# Patient Record
Sex: Female | Born: 1960 | Race: White | Hispanic: No | Marital: Married
Health system: Southern US, Community
[De-identification: ages and names within clinical notes are randomized; demographics above are authoritative.]

---

## 1998-08-12 ENCOUNTER — Other Ambulatory Visit: Admission: RE | Admit: 1998-08-12 | Discharge: 1998-08-12 | Payer: Self-pay | Admitting: Obstetrics and Gynecology

## 2003-08-20 ENCOUNTER — Other Ambulatory Visit: Admission: RE | Admit: 2003-08-20 | Discharge: 2003-08-20 | Payer: Self-pay | Admitting: Obstetrics and Gynecology

## 2003-08-29 ENCOUNTER — Emergency Department (HOSPITAL_COMMUNITY): Admission: EM | Admit: 2003-08-29 | Discharge: 2003-08-29 | Payer: Self-pay | Admitting: Emergency Medicine

## 2004-04-16 ENCOUNTER — Other Ambulatory Visit: Admission: RE | Admit: 2004-04-16 | Discharge: 2004-04-16 | Payer: Self-pay | Admitting: Diagnostic Radiology

## 2008-11-05 ENCOUNTER — Encounter: Admission: RE | Admit: 2008-11-05 | Discharge: 2008-11-05 | Payer: Self-pay | Admitting: Endocrinology

## 2010-05-13 ENCOUNTER — Encounter: Admission: RE | Admit: 2010-05-13 | Discharge: 2010-05-13 | Payer: Self-pay | Admitting: Endocrinology

## 2010-07-27 ENCOUNTER — Encounter: Payer: Self-pay | Admitting: Endocrinology

## 2011-04-28 ENCOUNTER — Other Ambulatory Visit: Payer: Self-pay | Admitting: Endocrinology

## 2011-04-28 DIAGNOSIS — E049 Nontoxic goiter, unspecified: Secondary | ICD-10-CM

## 2011-05-04 ENCOUNTER — Ambulatory Visit
Admission: RE | Admit: 2011-05-04 | Discharge: 2011-05-04 | Disposition: A | Discharge status: Home / Self Care | Payer: BC Managed Care – PPO | Source: Ambulatory Visit | Attending: Endocrinology | Admitting: Endocrinology

## 2011-05-04 DIAGNOSIS — E049 Nontoxic goiter, unspecified: Secondary | ICD-10-CM

## 2012-05-03 ENCOUNTER — Other Ambulatory Visit: Payer: Self-pay | Admitting: Endocrinology

## 2012-05-03 DIAGNOSIS — E049 Nontoxic goiter, unspecified: Secondary | ICD-10-CM

## 2012-06-01 ENCOUNTER — Ambulatory Visit
Admission: RE | Admit: 2012-06-01 | Discharge: 2012-06-01 | Disposition: A | Discharge status: Home / Self Care | Payer: BC Managed Care – PPO | Source: Ambulatory Visit | Attending: Endocrinology | Admitting: Endocrinology

## 2012-06-01 DIAGNOSIS — E049 Nontoxic goiter, unspecified: Secondary | ICD-10-CM

## 2012-07-21 ENCOUNTER — Ambulatory Visit: Payer: BC Managed Care – PPO | Admitting: Obstetrics and Gynecology

## 2012-10-24 ENCOUNTER — Other Ambulatory Visit: Payer: Self-pay | Admitting: Endocrinology

## 2012-10-24 DIAGNOSIS — E049 Nontoxic goiter, unspecified: Secondary | ICD-10-CM

## 2012-10-27 ENCOUNTER — Ambulatory Visit
Admission: RE | Admit: 2012-10-27 | Discharge: 2012-10-27 | Disposition: A | Discharge status: Home / Self Care | Payer: BC Managed Care – PPO | Source: Ambulatory Visit | Attending: Endocrinology | Admitting: Endocrinology

## 2012-10-27 DIAGNOSIS — E049 Nontoxic goiter, unspecified: Secondary | ICD-10-CM

## 2013-09-01 ENCOUNTER — Other Ambulatory Visit: Payer: Self-pay | Admitting: Endocrinology

## 2013-09-01 DIAGNOSIS — E049 Nontoxic goiter, unspecified: Secondary | ICD-10-CM

## 2013-09-25 ENCOUNTER — Ambulatory Visit
Admission: RE | Admit: 2013-09-25 | Discharge: 2013-09-25 | Disposition: A | Discharge status: Home / Self Care | Payer: 59 | Source: Ambulatory Visit | Attending: Endocrinology | Admitting: Endocrinology

## 2013-09-25 DIAGNOSIS — E049 Nontoxic goiter, unspecified: Secondary | ICD-10-CM

## 2013-09-26 ENCOUNTER — Other Ambulatory Visit: Payer: Self-pay | Admitting: Endocrinology

## 2013-09-26 DIAGNOSIS — E049 Nontoxic goiter, unspecified: Secondary | ICD-10-CM

## 2013-10-27 ENCOUNTER — Ambulatory Visit: Admit: 2013-10-27 | Payer: PRIVATE HEALTH INSURANCE

## 2013-10-27 ENCOUNTER — Inpatient Hospital Stay: Admit: 2013-10-27 | Payer: PRIVATE HEALTH INSURANCE

## 2013-10-27 DIAGNOSIS — Z0000 Encounter for general adult medical examination without abnormal findings: Secondary | ICD-10-CM

## 2013-10-27 DIAGNOSIS — N318 Other neuromuscular dysfunction of bladder: Secondary | ICD-10-CM

## 2013-10-27 LAB — URINE CULTURE: Culture Result: <1000

## 2013-10-27 LAB — URINALYSIS WITH MICROSCOPIC
Bilirubin, UA: NEGATIVE
Glucose, UA: NEGATIVE mg/dL
Ketones, UA: NEGATIVE mg/dL
Leukocytes, UA: NEGATIVE
Nitrite, UA: NEGATIVE
Protein, UA: NEGATIVE mg/dL
RBC, UA: 2 /HPF (ref 0–3)
Specific Gravity, UA: 1.011 (ref 1.005–1.035)
Squam Epithel, UA: <1 /HPF (ref 0–5)
Urobilinogen, UA: <2 mg/dL (ref 0.2–1.9)
WBC, UA: 2 /HPF (ref 0–5)
pH, UA: 5 (ref 5.0–8.0)

## 2013-10-27 NOTE — Unmapped (Signed)
Subjective  HPI:   Patient ID: Rebecca Valencia is a 53 y.o. female.    Chief Complaint:  Urinary Tract Infection   This is a new problem. The current episode started more than 1 month ago (since january end, [place], [employer] pad[employer] The problem occurs every urination. The quality of the pain is described as burning. There has been no fever. She is sexually active. There is no history of pyelonephritis. Associated symptoms include frequency and urgency. Pertinent negatives include no chills, discharge, flank pain, hematuria, hesitancy, nausea, possible pregnancy, sweats or vomiting. She has tried increased fluids (cranberry juice) for the symptoms. The treatment provided mild relief. There is no history of catheterization, kidney stones, recurrent UTIs, urinary stasis or a urological procedure.            BP Readings from Last 5 Encounters:   10/27/13 136/90     3 wks ago was 124/80 at endocrine. 3 yrs ago took birth control pills and bp went up and quit the pill and bp went down.     Taking aleve. For the back pain from yard work. Not taken any 24-36 hrs.     Tetanus vaccine 51yrs ago.   Pneumococcal vaccine-?  Flu vaccine yes.   Varicella vaccine- had chicken pox.  Mammogram- 4 yrs ago. Forgets to go. Has not been to gynceologist.       Medications:  Current Outpatient Prescriptions   Medication Sig Dispense Refill   ??? levothyroxine (UNITHROID) 50 MCG tablet Take 50 mcg by mouth daily.         No current facility-administered medications for this visit.        ROS:   Review of Systems   Constitutional: Negative for chills.   Gastrointestinal: Negative for nausea and vomiting.   Genitourinary: Positive for urgency and frequency. Negative for hesitancy, hematuria and flank pain.   All other systems reviewed and are negative.           Objective:   Physical Exam          Filed Vitals:    10/27/13 1303   BP: 136/90   Pulse: 72   Height: 5' 4 (1.626 m)   Weight: 146 lb (66.225 kg)     Body mass index is 25.05  kg/(m^2).  Body surface area is 1.73 meters squared.                Assessment/Plan:   There is no problem list on file for this patient.

## 2013-10-27 NOTE — Unmapped (Signed)
Sees Dr. Horald Pollen in [place], [place]. That is where she is from/sees her once a year, with ultrasound and bloodwork.

## 2013-10-31 ENCOUNTER — Inpatient Hospital Stay: Admit: 2013-10-31 | Payer: PRIVATE HEALTH INSURANCE

## 2013-10-31 DIAGNOSIS — Z0000 Encounter for general adult medical examination without abnormal findings: Secondary | ICD-10-CM

## 2013-10-31 LAB — COMPREHENSIVE METABOLIC PANEL, SERUM
ALT: 9 U/L (ref 7–52)
AST (SGOT): 12 U/L — ABNORMAL LOW (ref 13–39)
Albumin: 4.4 g/dL (ref 3.5–5.7)
Alkaline Phosphatase: 52 U/L (ref 34–104)
Anion Gap: 6 mmol/L (ref 3–16)
BUN: 22 mg/dL (ref 7–25)
CO2: 29 mmol/L (ref 21–31)
Calcium: 9.5 mg/dL (ref 8.6–10.3)
Chloride: 104 mmol/L (ref 98–110)
Creatinine: 0.84 mg/dL (ref 0.60–1.30)
GFR MDRD Af Amer: 85 See note.
GFR MDRD Non Af Amer: 71 See note.
Glucose: 89 mg/dL (ref 70–100)
Osmolality, Calculated: 291 mosm/kg (ref 278–305)
Potassium: 4.9 mmol/L (ref 3.5–5.3)
Sodium: 139 mmol/L (ref 133–146)
Total Bilirubin: 0.6 mg/dL (ref 0.3–1.2)
Total Protein: 7.1 g/dL (ref 6.4–8.9)

## 2013-10-31 LAB — LIPID PANEL
Cholesterol, Total: 194 mg/dL (ref 0–200)
HDL: 49 mg/dL (ref 23–92)
LDL Cholesterol: 127 mg/dL — ABNORMAL HIGH (ref 0–100)
Triglycerides: 90 mg/dL (ref 48–352)

## 2013-10-31 LAB — CBC
Hematocrit: 45.2 % — ABNORMAL HIGH (ref 35.0–45.0)
Hemoglobin: 15.3 g/dL (ref 11.7–15.5)
MCH: 30.7 pg (ref 27.0–33.0)
MCHC: 33.7 g/dL (ref 32.0–36.0)
MCV: 90.9 fL (ref 80.0–100.0)
MPV: 10.3 fL (ref 7.5–11.5)
Platelets: 233 10E3/uL (ref 140–400)
RBC: 4.97 10E6/uL (ref 3.80–5.10)
RDW: 13 % (ref 11.0–15.0)
WBC: 5.1 10E3/uL (ref 3.8–10.8)

## 2014-09-27 ENCOUNTER — Other Ambulatory Visit: Payer: 59

## 2014-11-20 ENCOUNTER — Other Ambulatory Visit: Payer: Self-pay | Admitting: Endocrinology

## 2014-11-20 DIAGNOSIS — E049 Nontoxic goiter, unspecified: Secondary | ICD-10-CM

## 2015-11-15 ENCOUNTER — Ambulatory Visit
Admission: RE | Admit: 2015-11-15 | Discharge: 2015-11-15 | Disposition: A | Discharge status: Home / Self Care | Payer: BLUE CROSS/BLUE SHIELD | Source: Ambulatory Visit | Attending: Endocrinology | Admitting: Endocrinology

## 2015-11-15 DIAGNOSIS — E049 Nontoxic goiter, unspecified: Secondary | ICD-10-CM

## 2015-11-20 ENCOUNTER — Other Ambulatory Visit: Payer: Self-pay

## 2016-09-16 ENCOUNTER — Other Ambulatory Visit: Payer: Self-pay | Admitting: Endocrinology

## 2016-09-16 DIAGNOSIS — E049 Nontoxic goiter, unspecified: Secondary | ICD-10-CM

## 2016-11-12 ENCOUNTER — Other Ambulatory Visit: Payer: BLUE CROSS/BLUE SHIELD

## 2016-11-19 ENCOUNTER — Ambulatory Visit
Admission: RE | Admit: 2016-11-19 | Discharge: 2016-11-19 | Disposition: A | Discharge status: Home / Self Care | Payer: BLUE CROSS/BLUE SHIELD | Source: Ambulatory Visit | Attending: Endocrinology | Admitting: Endocrinology

## 2016-11-19 DIAGNOSIS — E049 Nontoxic goiter, unspecified: Secondary | ICD-10-CM

## 2016-12-11 ENCOUNTER — Other Ambulatory Visit: Payer: Self-pay | Admitting: Endocrinology

## 2016-12-11 DIAGNOSIS — E041 Nontoxic single thyroid nodule: Secondary | ICD-10-CM

## 2017-01-05 ENCOUNTER — Other Ambulatory Visit: Payer: BLUE CROSS/BLUE SHIELD

## 2017-01-26 ENCOUNTER — Other Ambulatory Visit (HOSPITAL_COMMUNITY)
Admission: RE | Admit: 2017-01-26 | Discharge: 2017-01-26 | Disposition: A | Discharge status: Home / Self Care | Payer: BLUE CROSS/BLUE SHIELD | Source: Ambulatory Visit | Attending: Radiology | Admitting: Radiology

## 2017-01-26 ENCOUNTER — Ambulatory Visit
Admission: RE | Admit: 2017-01-26 | Discharge: 2017-01-26 | Disposition: A | Discharge status: Home / Self Care | Payer: BLUE CROSS/BLUE SHIELD | Source: Ambulatory Visit | Attending: Endocrinology | Admitting: Endocrinology

## 2017-01-26 DIAGNOSIS — D34 Benign neoplasm of thyroid gland: Secondary | ICD-10-CM | POA: Diagnosis not present

## 2017-01-26 DIAGNOSIS — E041 Nontoxic single thyroid nodule: Secondary | ICD-10-CM | POA: Diagnosis present

## 2017-04-23 NOTE — Unmapped (Signed)
I accessed chart to scrub for primary care provider assignment.  Patient has not seen primary care provider since 10/27/13; I changed the primary care provider to none.  Harleyquinn Gasser, Care Plan Coordinator [phone]

## 2017-04-23 NOTE — Unmapped (Signed)
Opened in error.  Krissie Merrick, Care Plan Coordinator [phone]

## 2017-08-05 IMAGING — US US THYROID
1 series · 13 of 25 positions shown · non-contrast
Comparison: 11/15/2015

CLINICAL DATA: Thyroid nodules

EXAM:
THYROID ULTRASOUND
TECHNIQUE: Ultrasound examination of the thyroid gland and adjacent soft
tissues was performed.

[Series 1: us thyroid · 0.04mm/px · 13 of 61 slices shown]
[im 1/61]
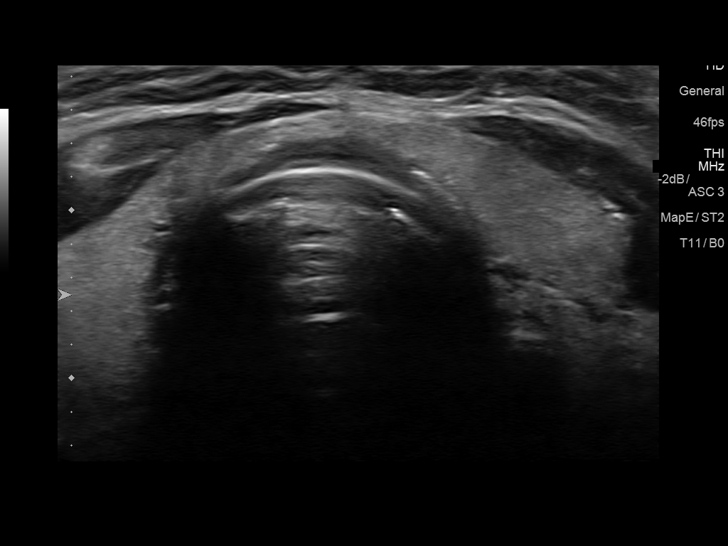
[im 6/61]
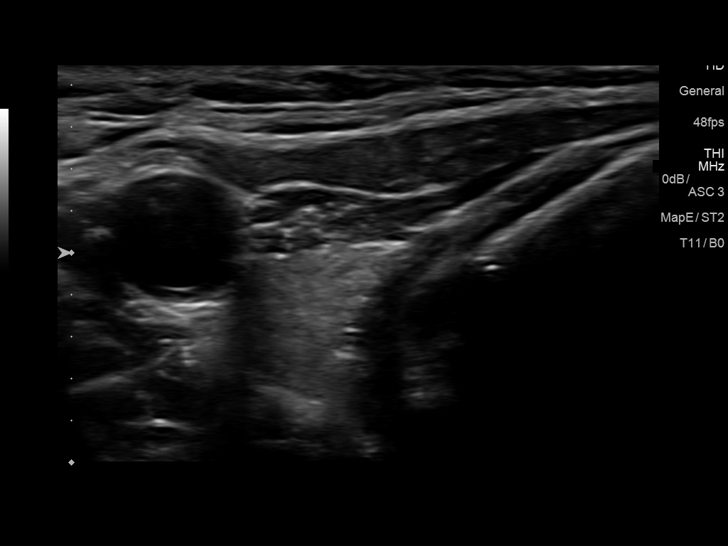
[im 11/61]
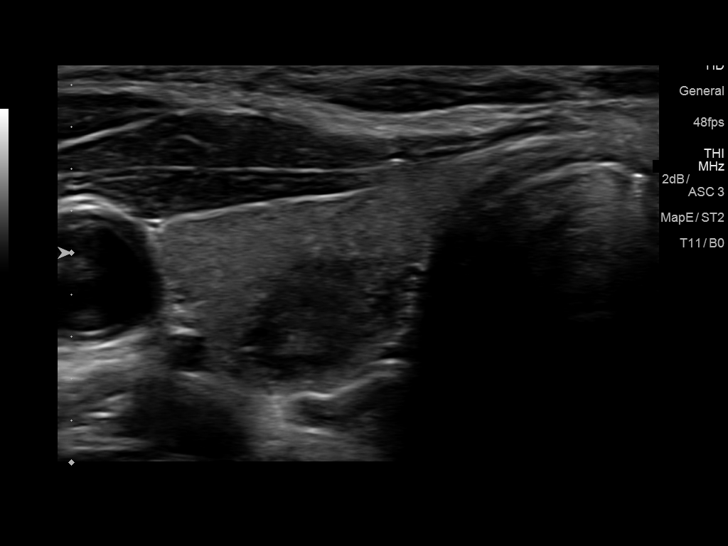
[im 16/61]
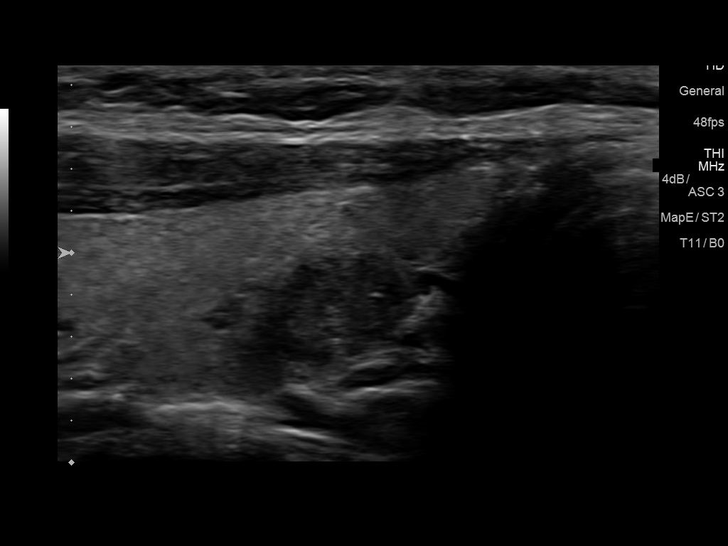
[im 21/61]
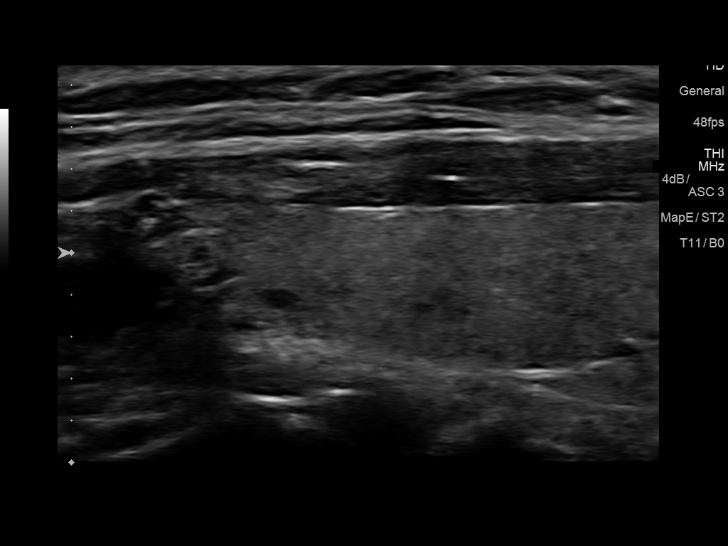
[im 26/61]
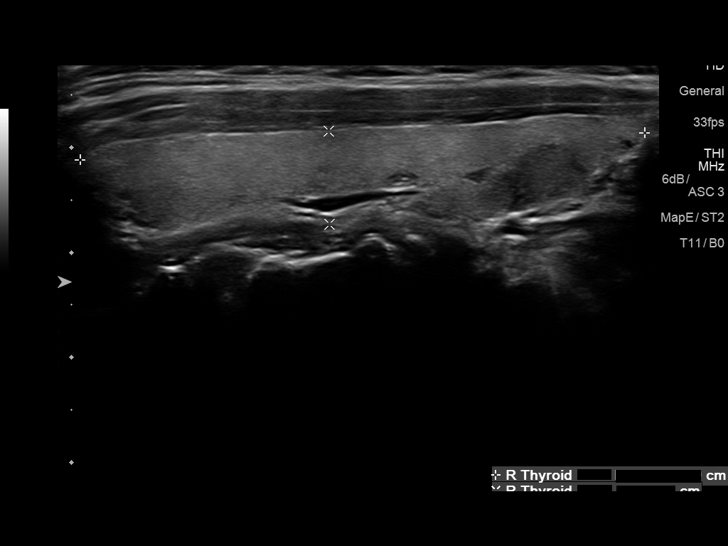
[im 31/61]
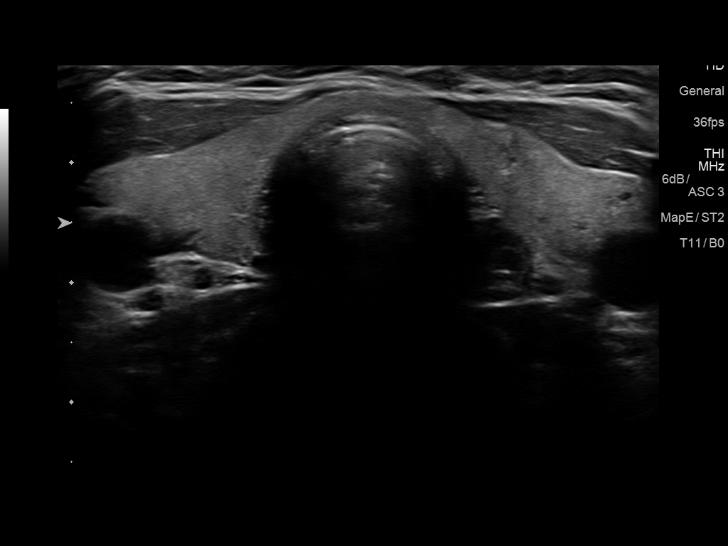
[im 36/61]
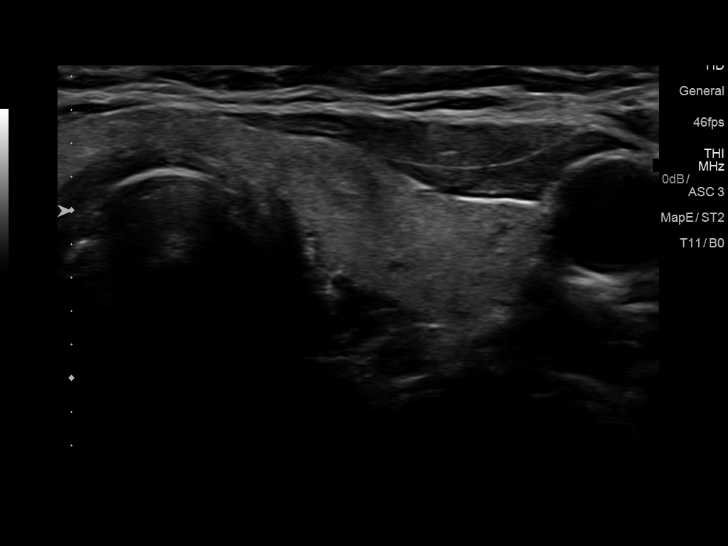
[im 41/61]
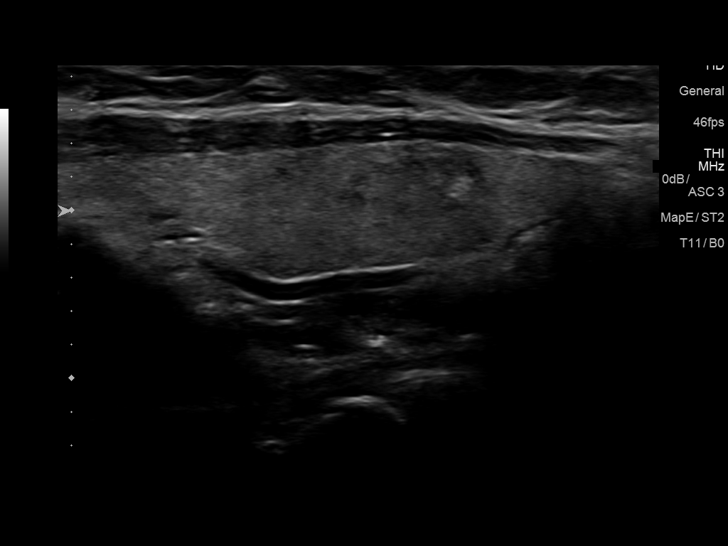
[im 46/61]
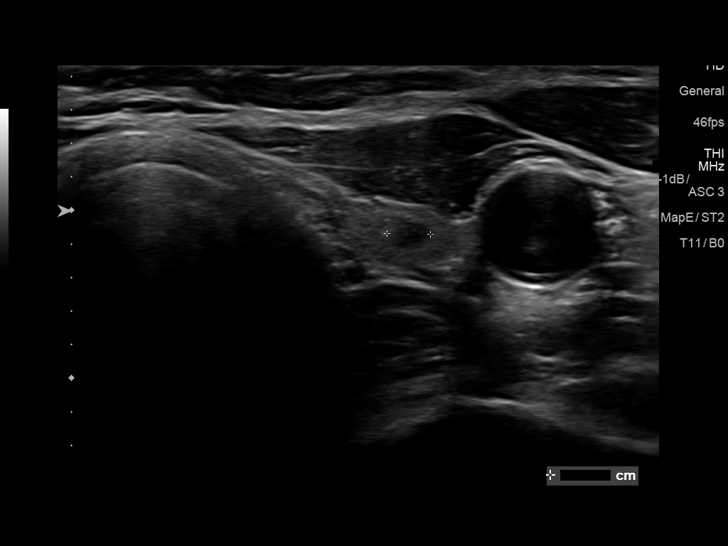
[im 51/61]
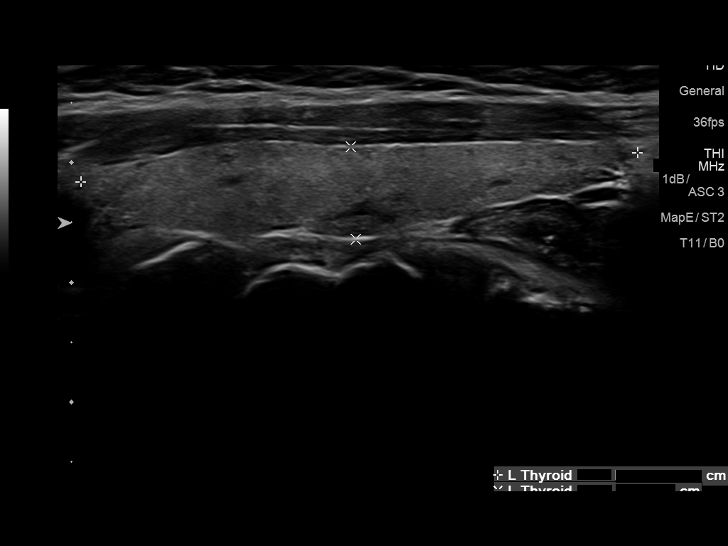
[im 56/61]
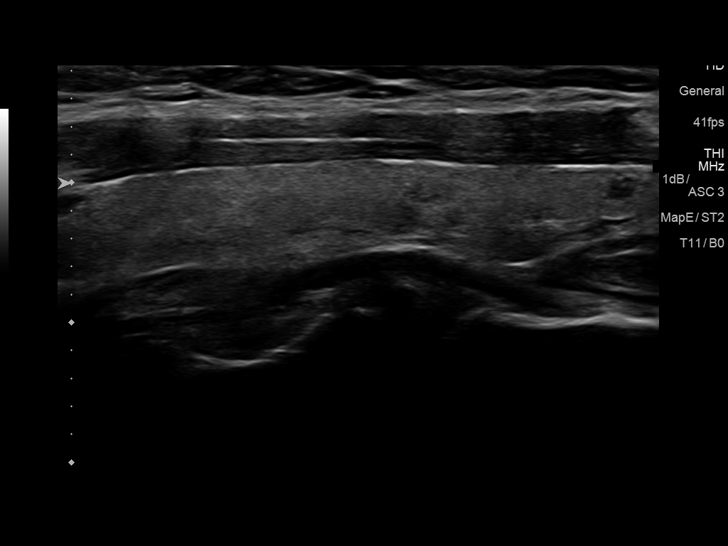
[im 61/61]
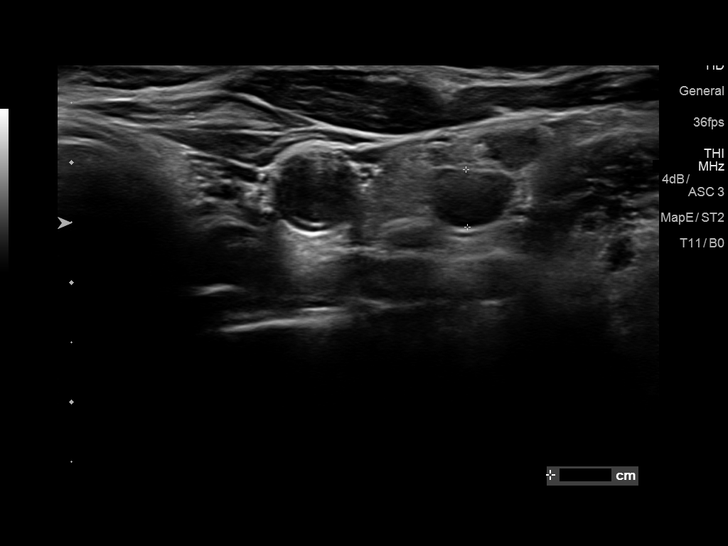

[13 of 25 positions shown; findings below may reference images not displayed]

FINDINGS: Parenchymal Echotexture: Normal

Isthmus: 2 mm, unchanged

Right lobe: 5.4 x 0.9 x 1.6 cm, previously 5.1 x 1.0 x 1.5 cm

Left lobe: 4.7 x 0.8 x 1.5 cm, previously 4.3 x 0.8 x 1.6 cm

_________________________________________________________

Estimated total number of nodules >/= 1 cm: 1

Number of spongiform nodules >/=  2 cm not described below (TR1): 0

Number of mixed cystic and solid nodules >/= 1.5 cm not described
below (TR2): 0

_________________________________________________________

Nodule # 1:

Location: Right; Inferior

Maximum size: 1 cm, unchanged cm; Other 2 dimensions: 0.6 x 0.8,
previously 0.6 x 0.6 cm

Composition: solid/almost completely solid (2)

Echogenicity: hypoechoic (2)

Shape: not taller-than-wide (0)

Margins: ill-defined (0)

Echogenic foci: punctate echogenic foci (3)

ACR TI-RADS total points: 7.

ACR TI-RADS risk category: TR5 (>/= 7 points).

ACR TI-RADS recommendations:

**Given size (>/= 1.0 cm) and appearance, fine needle aspiration of
this highly suspicious nodule should be considered based on TI-RADS
criteria.

_________________________________________________________

Nodule # 2:

Location: Left; Inferior

Maximum size: 0.8, previously 0.7 cm; Other 2 dimensions: 0.5 x 0.6,
previously 0.4 x 0.5 Cm

Composition: solid/almost completely solid (2)

Echogenicity: isoechoic (1)

Shape: not taller-than-wide (0)

Margins: ill-defined (0)

Echogenic foci: none (0)

ACR TI-RADS total points: 3.

ACR TI-RADS risk category: TR3 (3 points).

ACR TI-RADS recommendations:

Given size (<1.4 cm) and appearance, this nodule does NOT meet
TI-RADS criteria for biopsy or dedicated follow-up.

_________________________________________________________
IMPRESSION: 1.0 cm right inferior solid hypoechoic nodule with a few scattered
microcalcifications (TR 5 nodule). This nodule meets criteria for
biopsy as above.

The above is in keeping with the ACR TI-RADS recommendations - [HOSPITAL] 2926;[DATE].

## 2018-07-21 ENCOUNTER — Other Ambulatory Visit: Payer: Self-pay | Admitting: Endocrinology

## 2018-07-21 DIAGNOSIS — E049 Nontoxic goiter, unspecified: Secondary | ICD-10-CM

## 2018-07-27 ENCOUNTER — Ambulatory Visit
Admission: RE | Admit: 2018-07-27 | Discharge: 2018-07-27 | Disposition: A | Discharge status: Home / Self Care | Payer: BLUE CROSS/BLUE SHIELD | Source: Ambulatory Visit | Attending: Endocrinology | Admitting: Endocrinology

## 2018-07-27 DIAGNOSIS — E049 Nontoxic goiter, unspecified: Secondary | ICD-10-CM

## 2019-04-12 IMAGING — US US THYROID
1 series · 13 of 25 positions shown · non-contrast
Comparison: 11/19/2016, 01/26/2017

CLINICAL DATA: Thyroid nodules status post biopsy 01/26/2017

EXAM:
THYROID ULTRASOUND
TECHNIQUE: Ultrasound examination of the thyroid gland and adjacent soft
tissues was performed.

[Series 1: us thyroid · 0.05mm/px · 13 of 46 slices shown]
[im 1/46]
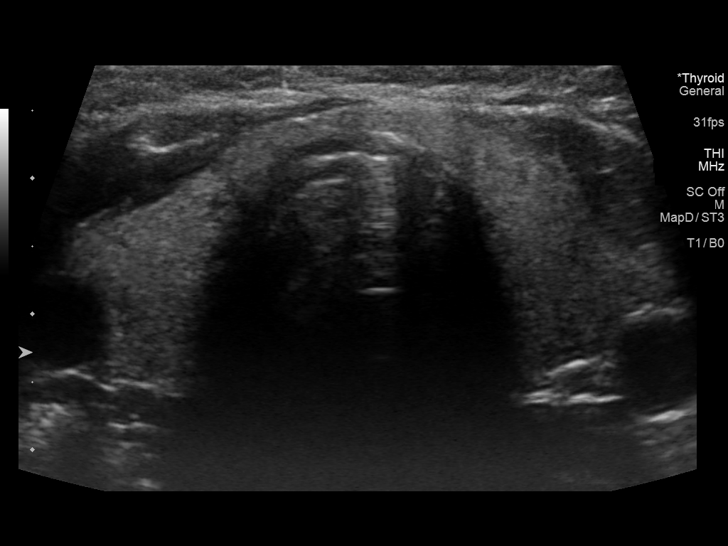
[im 4/46]
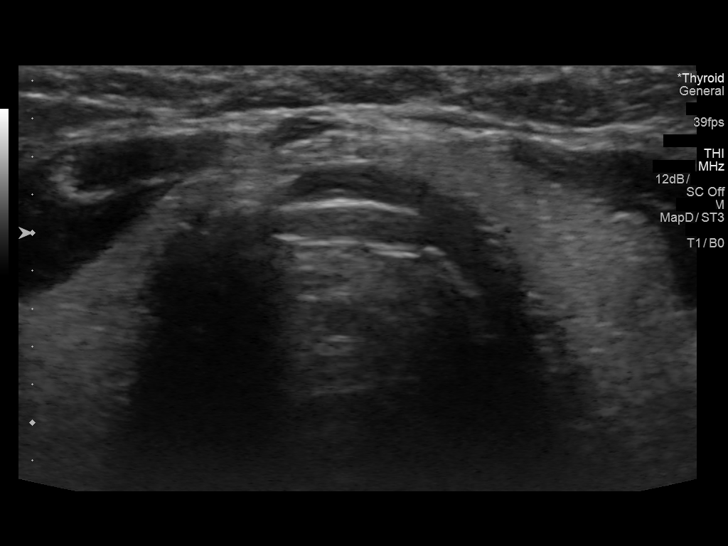
[im 8/46]
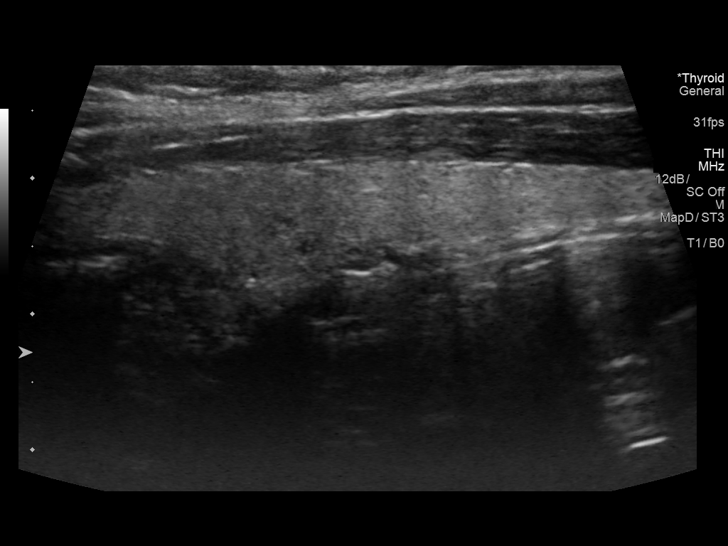
[im 12/46]
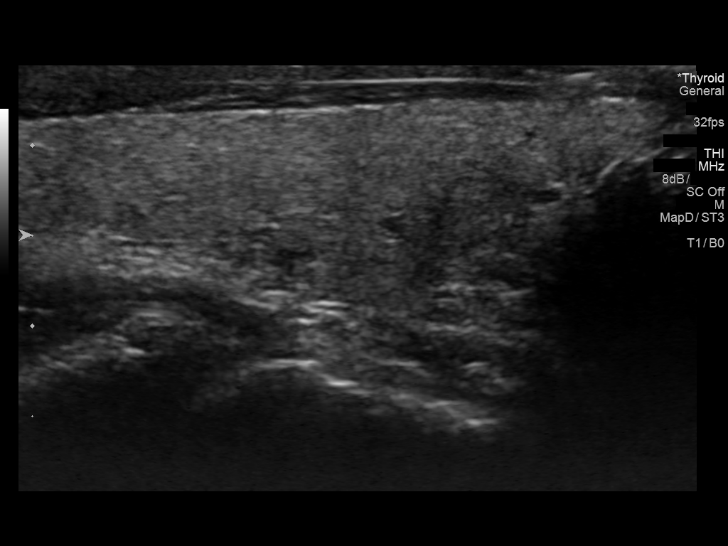
[im 16/46]
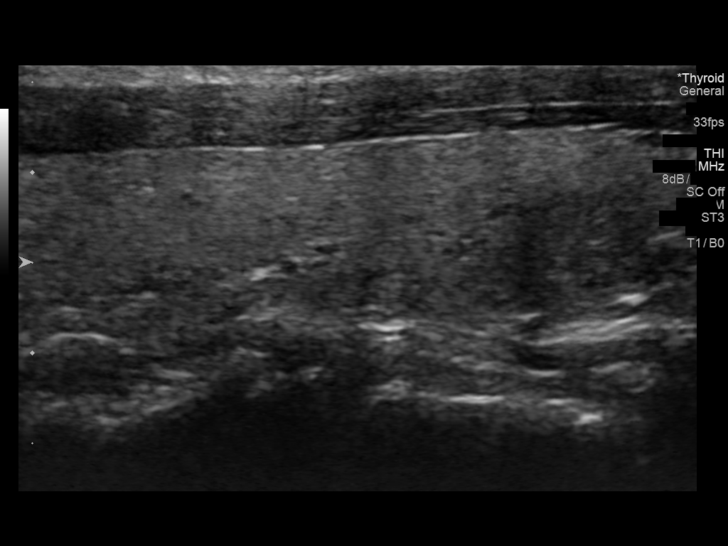
[im 19/46]
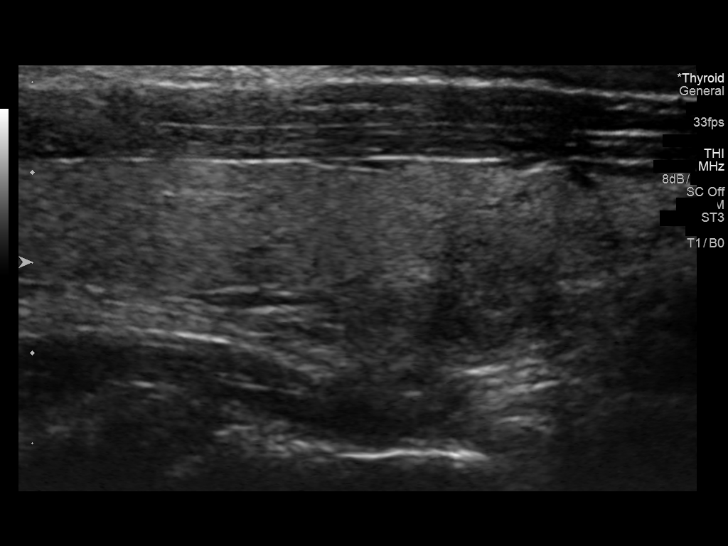
[im 23/46]
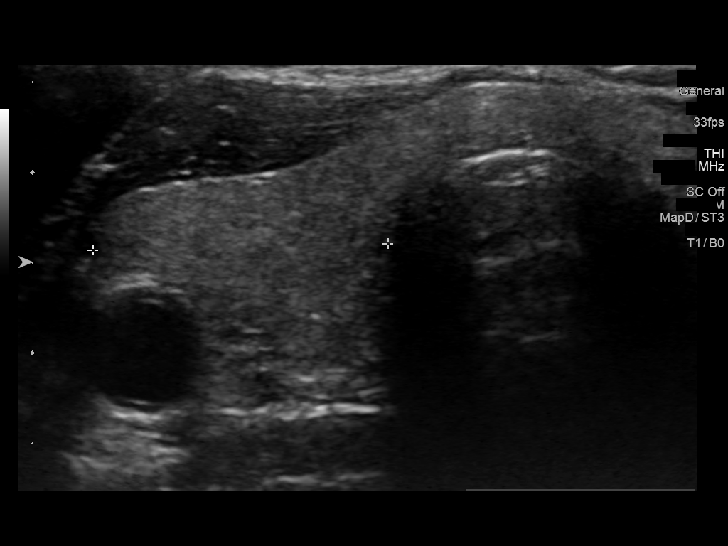
[im 27/46]
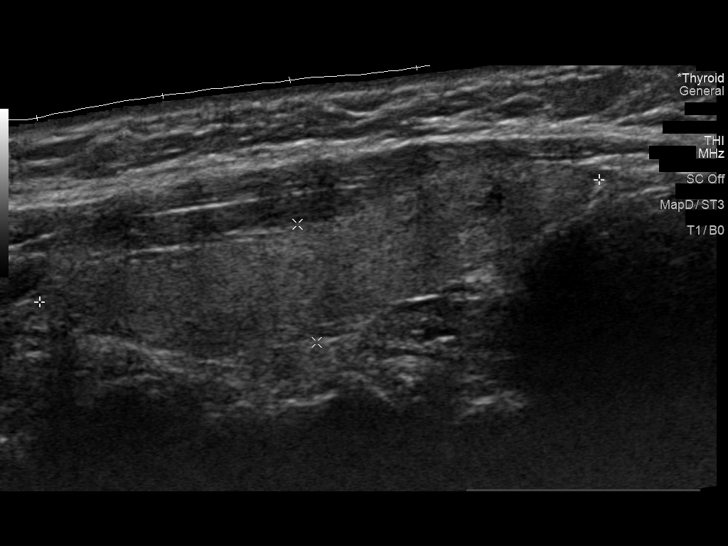
[im 31/46]
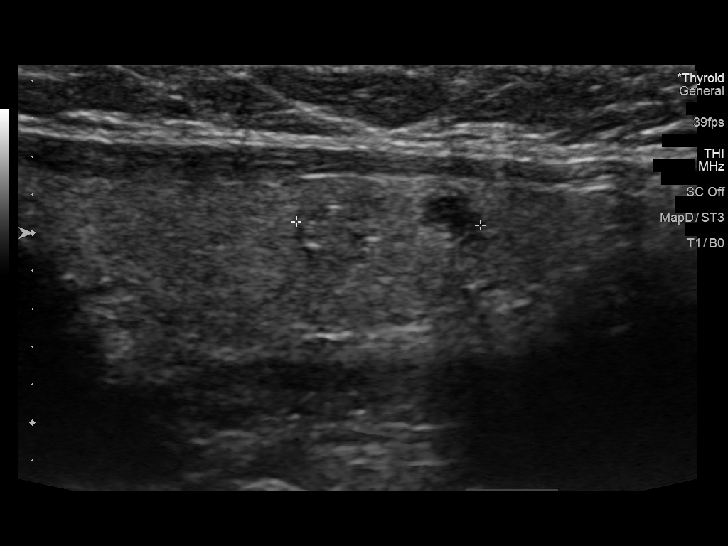
[im 34/46]
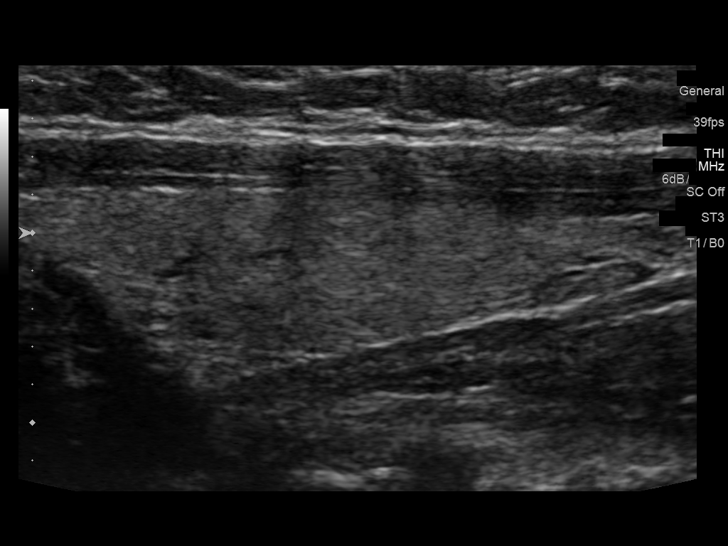
[im 38/46]
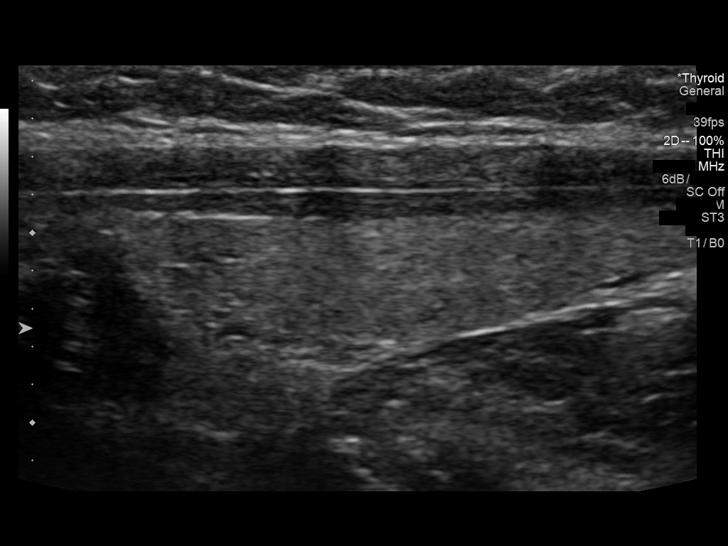
[im 42/46]
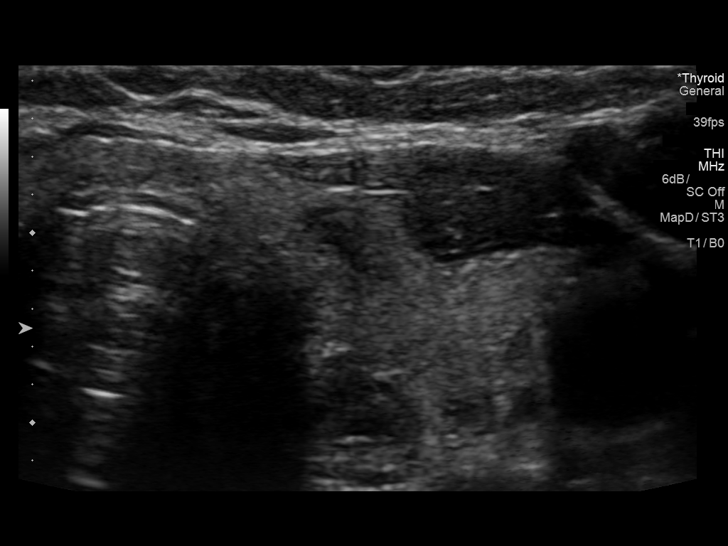
[im 46/46]
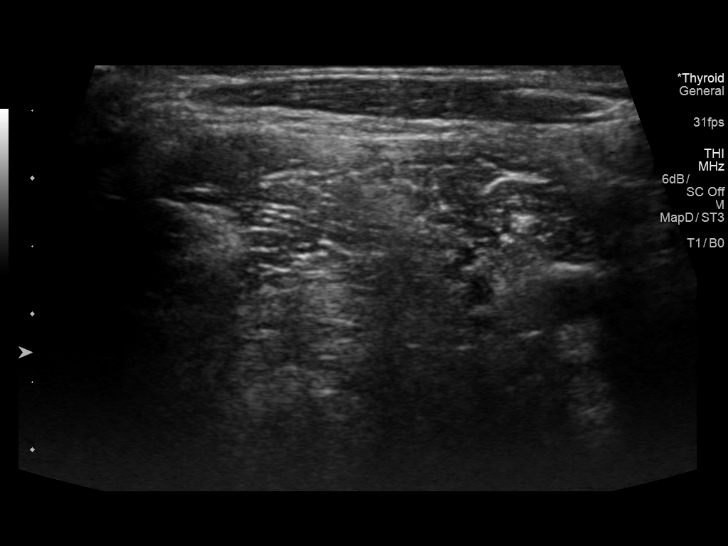

[13 of 25 positions shown; findings below may reference images not displayed]

FINDINGS: Parenchymal Echotexture: Mildly heterogenous

Isthmus: 3 mm

Right lobe: 4.6 x 0.9 x 1.6 cm, previously 5.4 x 0.9 x 1.6 cm

Left lobe: 4.5 x 0.9 x 1.4 cm, previously 4.7 x 0.8 x 1.5 cm

_________________________________________________________

Estimated total number of nodules >/= 1 cm: 1

Number of spongiform nodules >/=  2 cm not described below (TR1): 0

Number of mixed cystic and solid nodules >/= 1.5 cm not described
below (TR2): 0

_________________________________________________________

The right inferior thyroid TR 5 nodule previously biopsied now
measures 0.8 x 0.7 x 0.7 cm, previously 1.0 x 0.8 x 0.6 cm. No
significant interval change. Correlate with prior pathology.

The left inferior thyroid TR 3 nodule previously described
(11/19/2016) measures 1.0 x 0.7 x 0.6 cm, previously 0.8 x 0.6 x
cm. This nodule does not meet criteria for any biopsy or follow-up.

No other new thyroid abnormality. Normal vascularity. No surrounding
adenopathy.
IMPRESSION: Stable dominant bilateral thyroid nodules as detailed above. No
significant interval change or new finding.

The above is in keeping with the ACR TI-RADS recommendations - [HOSPITAL] 3903;[DATE].

## 2019-11-03 ENCOUNTER — Other Ambulatory Visit: Payer: Self-pay | Admitting: Endocrinology

## 2019-11-03 DIAGNOSIS — E049 Nontoxic goiter, unspecified: Secondary | ICD-10-CM

## 2019-11-15 ENCOUNTER — Ambulatory Visit
Admission: RE | Admit: 2019-11-15 | Discharge: 2019-11-15 | Disposition: A | Discharge status: Home / Self Care | Payer: BLUE CROSS/BLUE SHIELD | Source: Ambulatory Visit | Attending: Endocrinology | Admitting: Endocrinology

## 2019-11-15 DIAGNOSIS — E049 Nontoxic goiter, unspecified: Secondary | ICD-10-CM

## 2021-10-02 ENCOUNTER — Other Ambulatory Visit: Payer: Self-pay | Admitting: Endocrinology

## 2021-10-02 DIAGNOSIS — E049 Nontoxic goiter, unspecified: Secondary | ICD-10-CM

## 2021-11-17 ENCOUNTER — Ambulatory Visit
Admission: RE | Admit: 2021-11-17 | Discharge: 2021-11-17 | Disposition: A | Discharge status: Home / Self Care | Payer: BC Managed Care – PPO | Source: Ambulatory Visit | Attending: Endocrinology | Admitting: Endocrinology

## 2021-11-17 DIAGNOSIS — E049 Nontoxic goiter, unspecified: Secondary | ICD-10-CM

## 2023-06-30 IMAGING — US US THYROID
1 series · 13 of 25 positions shown · non-contrast
Comparison: 11/15/2019, 07/27/2018

CLINICAL DATA: 60-year-old female with a history of thyroid nodules

Prior biopsy 01/26/2017, Right inferior thyroid nodule
EXAM:
THYROID ULTRASOUND
TECHNIQUE: Ultrasound examination of the thyroid gland and adjacent soft
tissues was performed.

[Series 1: us thyroid · 0.07mm/px · 13 of 43 slices shown]
[im 1/43]
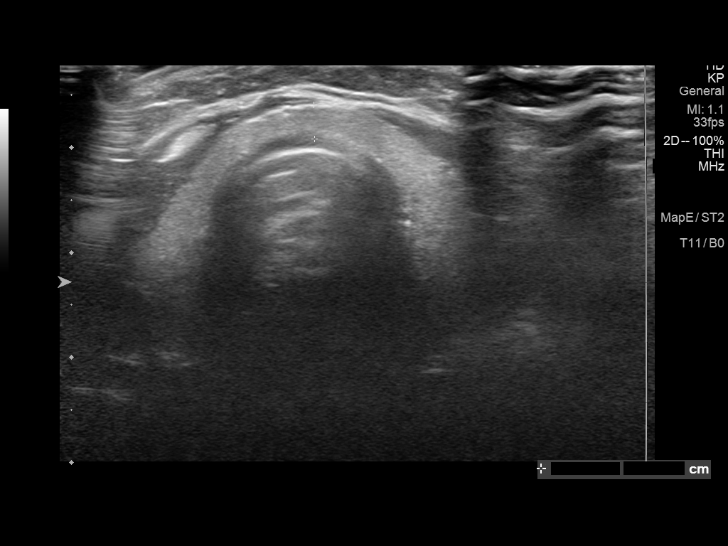
[im 4/43]
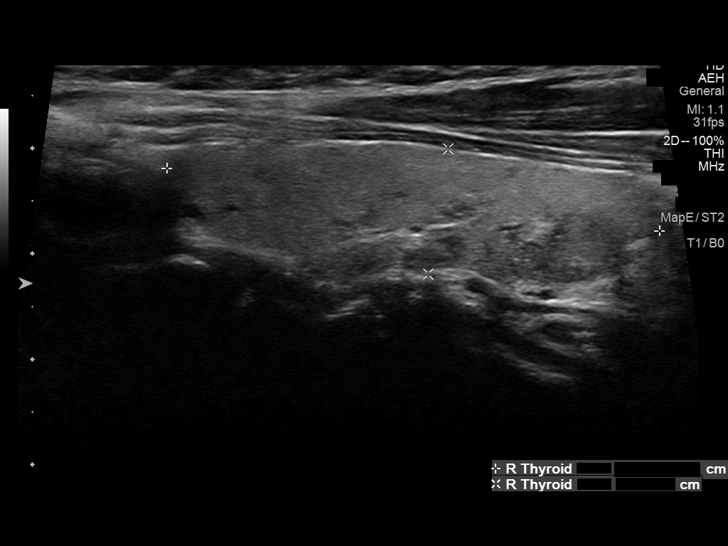
[im 8/43]
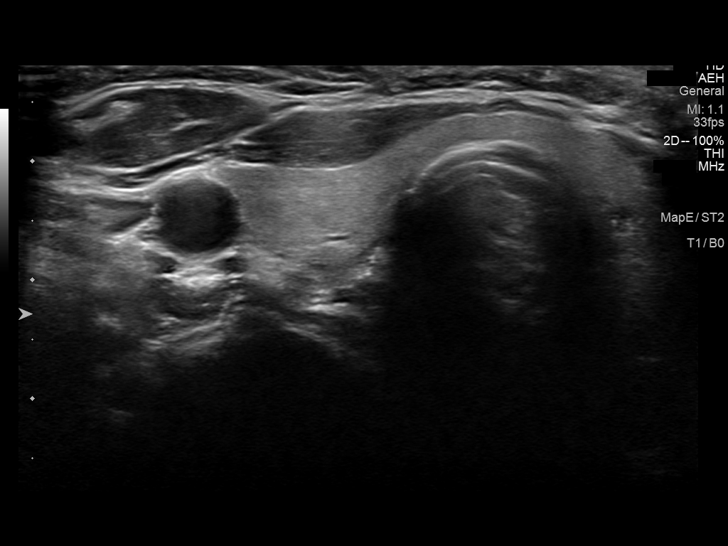
[im 11/43]
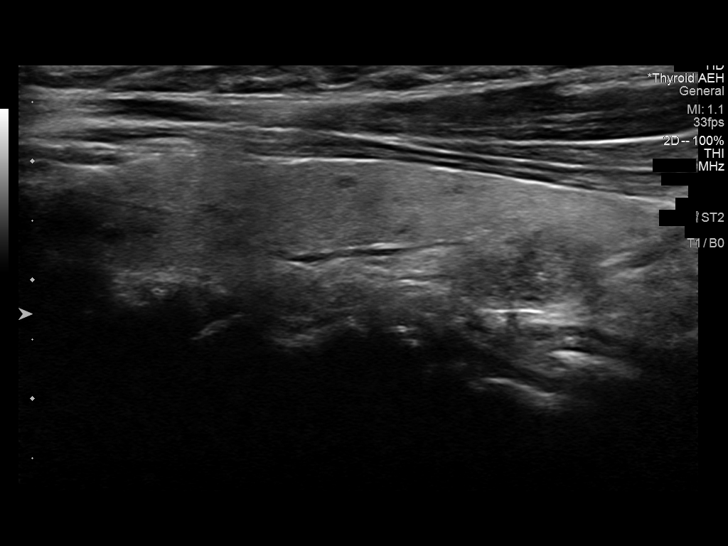
[im 15/43]
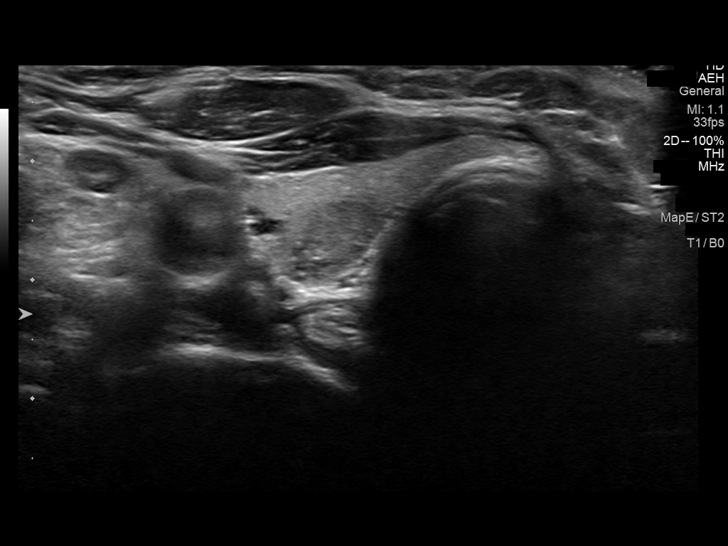
[im 18/43]
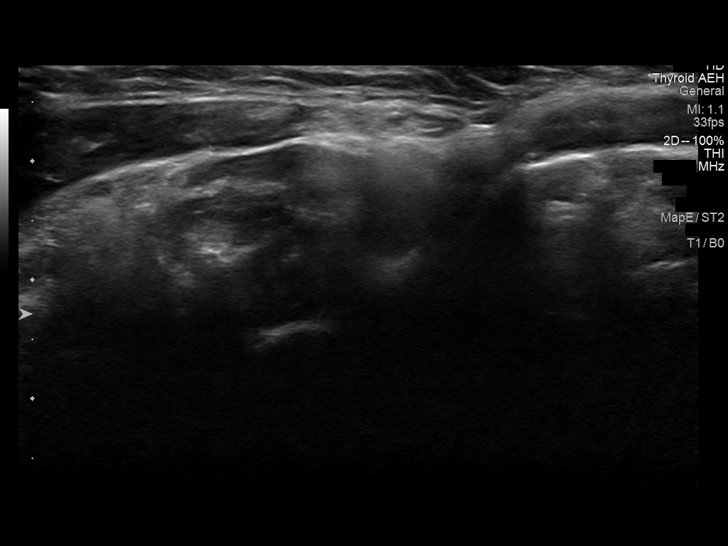
[im 22/43]
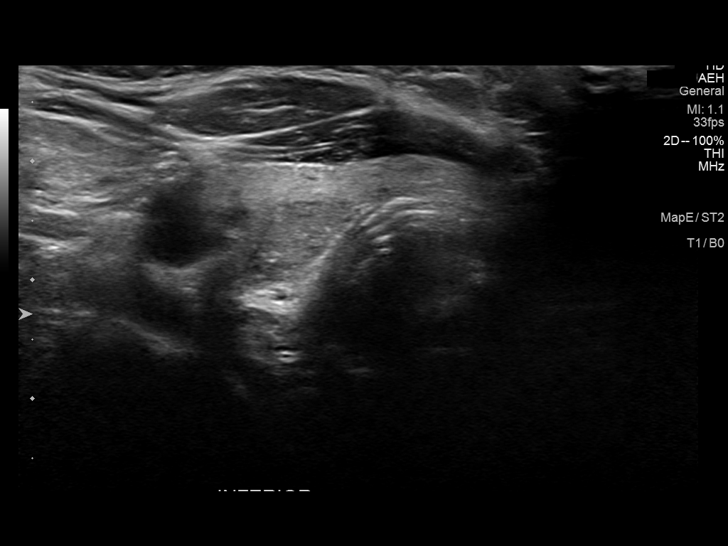
[im 25/43]
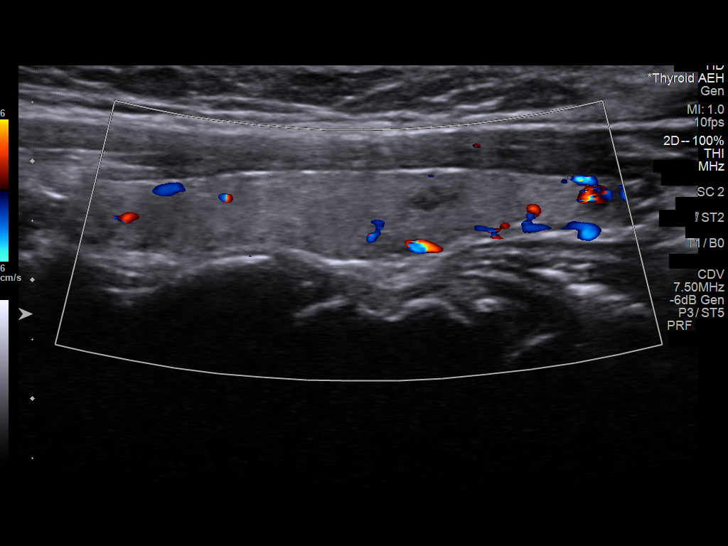
[im 29/43]
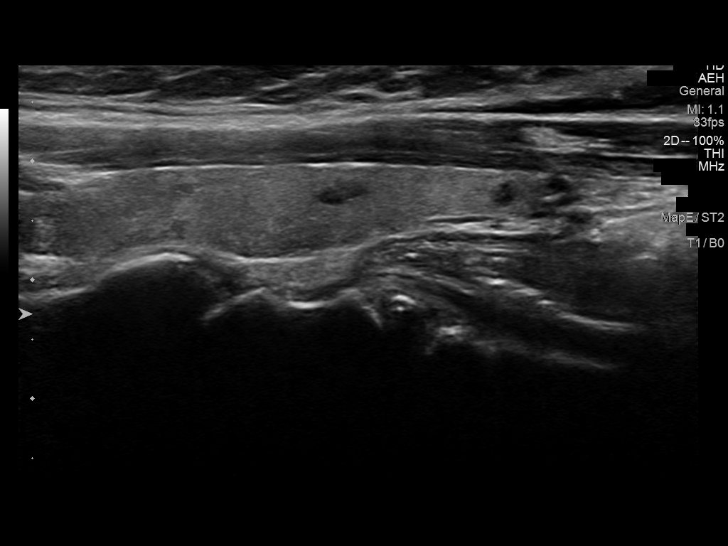
[im 32/43]
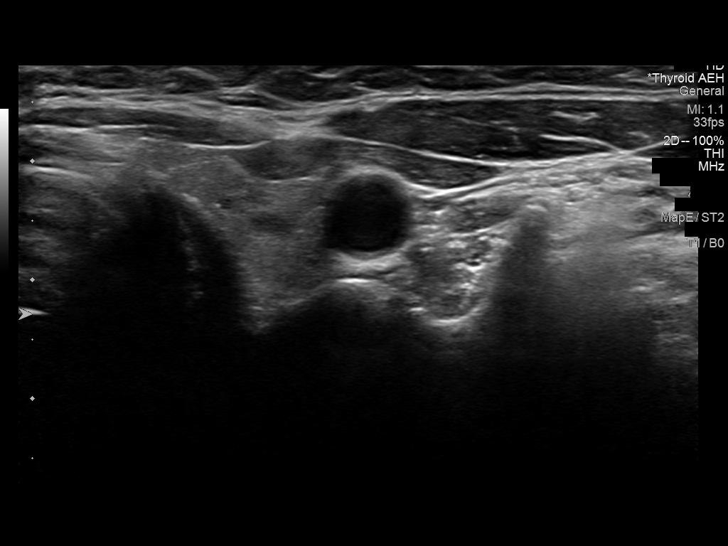
[im 36/43]
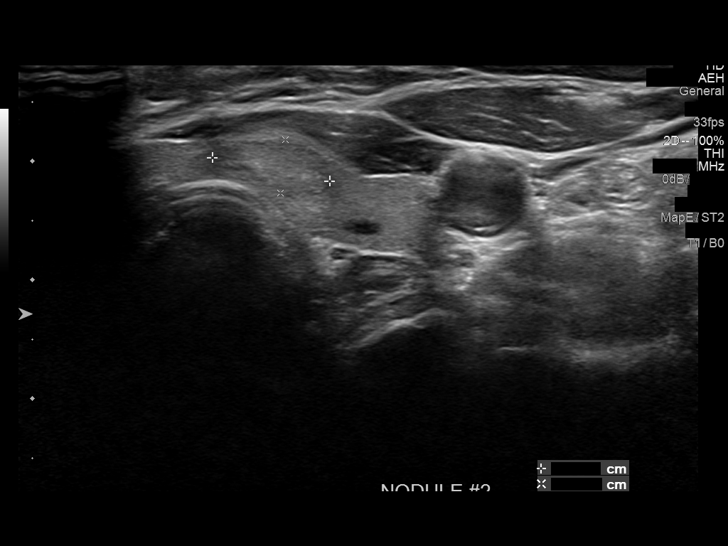
[im 39/43]
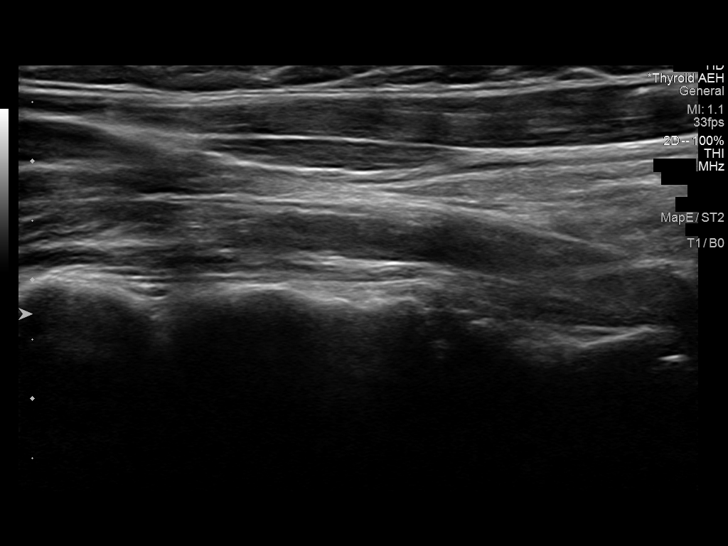
[im 43/43]
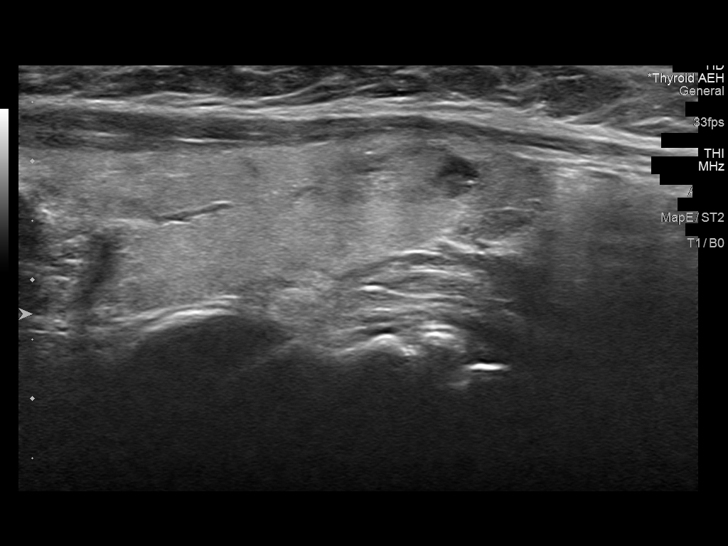

[13 of 25 positions shown; findings below may reference images not displayed]

FINDINGS: Parenchymal Echotexture: Mildly heterogenous

Isthmus: 0.3 cm 4

Right lobe: 0.7 cm x 1.2 cm x 1.5 cm

Left lobe: 4.6 cm x 0.8 cm x 1.5 cm

_________________________________________________________

Estimated total number of nodules >/= 1 cm: 1

Number of spongiform nodules >/=  2 cm not described below (TR1): 0

Number of mixed cystic and solid nodules >/= 1.5 cm not described
below (TR2): 0

_________________________________________________________

Nodule labeled 1 in the lower right thyroid, previously biopsied.
Decreasing in size. Assuming benign result, no further specific
follow-up would be indicated.

Nodule labeled 2, inferior left thyroid, 1.2 cm unchanged. This is
TR 3 characteristics and does not meet criteria for further
surveillance.

No adenopathy.

Recommendations follow those established by the new ACR TI-RADS
criteria ([HOSPITAL] [id number];[DATE]).
IMPRESSION: Heterogeneous thyroid, suggesting medical thyroid disease.

Unchanged nodules, as above.
